# Patient Record
Sex: Male | Born: 2000 | Race: White | Hispanic: No | Marital: Single | Smoking: Never smoker
Health system: Southern US, Community
[De-identification: ages and names within clinical notes are randomized; demographics above are authoritative.]

---
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
## 2022-10-04 DIAGNOSIS — Z0000 Encounter for general adult medical examination without abnormal findings: Secondary | ICD-10-CM | POA: Diagnosis not present

## 2023-06-02 DIAGNOSIS — F411 Generalized anxiety disorder: Secondary | ICD-10-CM | POA: Diagnosis not present

## 2023-06-16 DIAGNOSIS — F411 Generalized anxiety disorder: Secondary | ICD-10-CM | POA: Diagnosis not present

## 2023-06-30 DIAGNOSIS — F411 Generalized anxiety disorder: Secondary | ICD-10-CM | POA: Diagnosis not present

## 2023-07-23 DIAGNOSIS — F411 Generalized anxiety disorder: Secondary | ICD-10-CM | POA: Diagnosis not present

## 2023-08-06 DIAGNOSIS — F411 Generalized anxiety disorder: Secondary | ICD-10-CM | POA: Diagnosis not present

## 2023-08-11 DIAGNOSIS — F411 Generalized anxiety disorder: Secondary | ICD-10-CM | POA: Diagnosis not present

## 2023-08-25 DIAGNOSIS — F411 Generalized anxiety disorder: Secondary | ICD-10-CM | POA: Diagnosis not present

## 2023-09-08 DIAGNOSIS — F411 Generalized anxiety disorder: Secondary | ICD-10-CM | POA: Diagnosis not present

## 2023-09-25 DIAGNOSIS — F411 Generalized anxiety disorder: Secondary | ICD-10-CM | POA: Diagnosis not present

## 2023-10-06 DIAGNOSIS — F411 Generalized anxiety disorder: Secondary | ICD-10-CM | POA: Diagnosis not present

## 2023-10-20 DIAGNOSIS — F411 Generalized anxiety disorder: Secondary | ICD-10-CM | POA: Diagnosis not present

## 2023-11-03 DIAGNOSIS — F411 Generalized anxiety disorder: Secondary | ICD-10-CM | POA: Diagnosis not present

## 2023-11-17 DIAGNOSIS — F411 Generalized anxiety disorder: Secondary | ICD-10-CM | POA: Diagnosis not present

## 2023-12-01 DIAGNOSIS — F411 Generalized anxiety disorder: Secondary | ICD-10-CM | POA: Diagnosis not present

## 2023-12-18 DIAGNOSIS — F411 Generalized anxiety disorder: Secondary | ICD-10-CM | POA: Diagnosis not present

## 2024-01-08 DIAGNOSIS — F411 Generalized anxiety disorder: Secondary | ICD-10-CM | POA: Diagnosis not present

## 2024-02-09 DIAGNOSIS — F411 Generalized anxiety disorder: Secondary | ICD-10-CM | POA: Diagnosis not present

## 2024-02-26 DIAGNOSIS — F411 Generalized anxiety disorder: Secondary | ICD-10-CM | POA: Diagnosis not present

## 2024-03-08 DIAGNOSIS — F411 Generalized anxiety disorder: Secondary | ICD-10-CM | POA: Diagnosis not present

## 2024-04-02 DIAGNOSIS — F411 Generalized anxiety disorder: Secondary | ICD-10-CM | POA: Diagnosis not present

## 2024-04-12 DIAGNOSIS — F411 Generalized anxiety disorder: Secondary | ICD-10-CM | POA: Diagnosis not present
